# Patient Record
Sex: Male | Born: 1970 | Race: Black or African American | Hispanic: No | Marital: Single | Smoking: Current every day smoker
Health system: Southern US, Community
[De-identification: ages and names within clinical notes are randomized; demographics above are authoritative.]

---

## 2009-09-11 ENCOUNTER — Emergency Department: Payer: Self-pay | Admitting: Internal Medicine

## 2010-08-21 ENCOUNTER — Emergency Department: Payer: Self-pay | Admitting: Emergency Medicine

## 2010-09-16 ENCOUNTER — Emergency Department: Payer: Self-pay | Admitting: Emergency Medicine

## 2014-02-12 ENCOUNTER — Emergency Department: Payer: Self-pay | Admitting: Emergency Medicine

## 2014-06-25 ENCOUNTER — Emergency Department
Admission: EM | Admit: 2014-06-25 | Discharge: 2014-06-25 | Disposition: A | Discharge status: Home / Self Care | Payer: Medicare Other | Attending: Emergency Medicine | Admitting: Emergency Medicine

## 2014-06-25 ENCOUNTER — Encounter: Payer: Self-pay | Admitting: Emergency Medicine

## 2014-06-25 DIAGNOSIS — Z720 Tobacco use: Secondary | ICD-10-CM | POA: Diagnosis not present

## 2014-06-25 DIAGNOSIS — J029 Acute pharyngitis, unspecified: Secondary | ICD-10-CM | POA: Insufficient documentation

## 2014-06-25 MED ORDER — AMOXICILLIN-POT CLAVULANATE 875-125 MG PO TABS
1.0000 | ORAL_TABLET | Freq: Once | ORAL | Status: AC
  Administered 2014-06-25: 1 via ORAL

## 2014-06-25 MED ORDER — LORATADINE-PSEUDOEPHEDRINE ER 5-120 MG PO TB12: 1.0000 | ORAL_TABLET | Freq: Two times a day (BID) | ORAL | Status: AC

## 2014-06-25 MED ORDER — DEXAMETHASONE SODIUM PHOSPHATE 10 MG/ML IJ SOLN
INTRAMUSCULAR | Status: AC
  Administered 2014-06-25: 10 mg via INTRAMUSCULAR
  Filled 2014-06-25: qty 1

## 2014-06-25 MED ORDER — DEXAMETHASONE SODIUM PHOSPHATE 10 MG/ML IJ SOLN
10.0000 mg | Freq: Once | INTRAMUSCULAR | Status: AC
  Administered 2014-06-25: 10 mg via INTRAMUSCULAR

## 2014-06-25 MED ORDER — AMOXICILLIN-POT CLAVULANATE 875-125 MG PO TABS
ORAL_TABLET | ORAL | Status: AC
  Administered 2014-06-25: 1 via ORAL
  Filled 2014-06-25: qty 1

## 2014-06-25 MED ORDER — IBUPROFEN 800 MG PO TABS: 800.0000 mg | ORAL_TABLET | Freq: Three times a day (TID) | ORAL | Status: DC | PRN

## 2014-06-25 MED ORDER — AMOXICILLIN-POT CLAVULANATE 875-125 MG PO TABS: 1.0000 | ORAL_TABLET | Freq: Two times a day (BID) | ORAL | Status: AC

## 2014-06-25 NOTE — ED Notes (Signed)
Pt informed to return if any life threatening symptoms occur.  

## 2014-06-25 NOTE — ED Notes (Signed)
Pt presents to ED with c/o sore throat x 3 days. Pt denies cough. Pt is awake and alert, speaking in complete, coherent sentences without difficulty during triage.

## 2014-06-25 NOTE — ED Provider Notes (Signed)
CSN: [id number]     Arrival date & time 06/25/14  1621 History   First MD Initiated Contact with Patient 06/25/14 1715     Chief Complaint  Patient presents with  . Sore Throat     (Consider location/radiation/quality/duration/timing/severity/associated sxs/prior Treatment) HPI History of present illness on this patient for the last 3 days he's had a worsening sore throat pain with swallowing no difficulty breathing or controlling his secretions subjective fevers taking Tylenol at home and arrived here today for further evaluation treatment rates pain as 8 out of 10 burning pain nothing making it significantly better or worse denies nausea vomiting diarrhea or any other symptoms at this time History reviewed. No pertinent past medical history. History reviewed. No pertinent past surgical history. No family history on file. History  Substance Use Topics  . Smoking status: Current Every Day Smoker  . Smokeless tobacco: Not on file  . Alcohol Use: Yes    Review of Systems  Constitutional: Negative.   HENT: Negative.   Eyes: Negative.   Respiratory: Negative.   Cardiovascular: Negative.   Gastrointestinal: Negative.   Musculoskeletal: Negative.   Skin: Negative.   All other systems reviewed and are negative.     Allergies  Review of patient's allergies indicates no known allergies.  Home Medications   Prior to Admission medications   Medication Sig Start Date End Date Taking? Authorizing Provider  amoxicillin-clavulanate (AUGMENTIN) 875-125 MG per tablet Take 1 tablet by mouth every 12 (twelve) hours. 06/25/14 07/05/14  Ader Fritze William C Zofia Peckinpaugh, PA-C  ibuprofen (ADVIL,MOTRIN) 800 MG tablet Take 1 tablet (800 mg total) by mouth every 8 (eight) hours as needed. 06/25/14   Myla Mauriello William C Dontavius Keim, PA-C  loratadine-pseudoephedrine (CLARITIN-D 12 HOUR) 5-120 MG per tablet Take 1 tablet by mouth 2 (two) times daily. 06/25/14 06/25/15  Eber Ferrufino William C Taleisha Kaczynski, PA-C   BP 127/77 mmHg  Pulse 77   Temp(Src) 98.5 F (36.9 C) (Oral)  Resp 18  Ht 5\' 3"  (1.6 m)  Wt 150 lb (68.04 kg)  BMI 26.58 kg/m2  SpO2 99% Physical Exam Male appearing stated age well-developed well-nourished no acute distress vitals reviewed Head ears eyes nose neck and throat examination of this patient reveals +1 tonsils with exudate bilateral anterior cervical adenopathy postnasal drainage pupils equal round reactive to light and accommodation extraocular motions intact Cardiovascular regular rate and rhythm no murmurs rubs gallops Pulmonary lungs process patient bilaterally Skin supple free rash or disease ED Course  Procedures   MDM  Decision making on this patient worsening sore throat for 3 days +1 tonsils exudate anterior cervical adenopathy treat this patient for presumptive bacterial pharyngitis was given Decadron amoxicillin here is to follow-up 2-3 days if not improving return here for any acute concerns or worsening symptoms Final diagnoses:  Pharyngitis        Dinesh Ulysse Rosalyn GessWilliam C Renee Beale, PA-C 06/25/14 1751  Sharman CheekPhillip Stafford, MD 06/26/14 0009

## 2015-12-31 ENCOUNTER — Encounter: Payer: Self-pay | Admitting: Emergency Medicine

## 2015-12-31 ENCOUNTER — Emergency Department
Admission: EM | Admit: 2015-12-31 | Discharge: 2015-12-31 | Disposition: A | Discharge status: Home / Self Care | Payer: Medicare Other | Attending: Student in an Organized Health Care Education/Training Program | Admitting: Student in an Organized Health Care Education/Training Program

## 2015-12-31 DIAGNOSIS — J029 Acute pharyngitis, unspecified: Secondary | ICD-10-CM | POA: Diagnosis present

## 2015-12-31 DIAGNOSIS — F17200 Nicotine dependence, unspecified, uncomplicated: Secondary | ICD-10-CM | POA: Insufficient documentation

## 2015-12-31 DIAGNOSIS — B2790 Infectious mononucleosis, unspecified without complication: Secondary | ICD-10-CM | POA: Diagnosis not present

## 2015-12-31 DIAGNOSIS — Z791 Long term (current) use of non-steroidal anti-inflammatories (NSAID): Secondary | ICD-10-CM | POA: Diagnosis not present

## 2015-12-31 LAB — MONONUCLEOSIS SCREEN: Mono Screen: POSITIVE — AB

## 2015-12-31 LAB — CBC WITH DIFFERENTIAL/PLATELET
Basophils Absolute: 0.1 K/uL (ref 0–0.1)
Basophils Relative: 1 %
Eosinophils Absolute: 0.3 K/uL (ref 0–0.7)
Eosinophils Relative: 3 %
HCT: 35.8 % — ABNORMAL LOW (ref 40.0–52.0)
Hemoglobin: 12.2 g/dL — ABNORMAL LOW (ref 13.0–18.0)
Lymphocytes Relative: 38 %
Lymphs Abs: 3.7 K/uL — ABNORMAL HIGH (ref 1.0–3.6)
MCH: 28.4 pg (ref 26.0–34.0)
MCHC: 34.2 g/dL (ref 32.0–36.0)
MCV: 83.1 fL (ref 80.0–100.0)
Monocytes Absolute: 1 K/uL (ref 0.2–1.0)
Monocytes Relative: 11 %
Neutro Abs: 4.6 K/uL (ref 1.4–6.5)
Neutrophils Relative %: 47 %
Platelets: 260 K/uL (ref 150–440)
RBC: 4.31 MIL/uL — ABNORMAL LOW (ref 4.40–5.90)
RDW: 14.6 % — ABNORMAL HIGH (ref 11.5–14.5)
WBC: 9.6 K/uL (ref 3.8–10.6)

## 2015-12-31 LAB — POCT RAPID STREP A: Streptococcus, Group A Screen (Direct): NEGATIVE

## 2015-12-31 MED ORDER — PREDNISONE 10 MG PO TABS: 40.0000 mg | ORAL_TABLET | Freq: Every day | ORAL | 0 refills | Status: AC

## 2015-12-31 NOTE — ED Provider Notes (Signed)
[facility] Emergency Department Provider Note  ____________________________________________   First MD Initiated Contact with Patient 12/31/15 2101     (approximate)  I have reviewed the triage vital signs and the nursing notes.   HISTORY  Chief Complaint Sore Throat   HPI Hoyle BarrMichael C Stoney Jr. is a [age] y.o. male presents with a sore throat and right-sided neck swelling for 4 days. Patient states he is having some difficulty swallowing due to pain. Patient denies additional cold symptoms. No fever. Patient states that this has happened once before and was given antibiotics and symptoms resolved. She has not taken anything for symptoms.   History reviewed. No pertinent past medical history.  There are no active problems to display for this patient.   History reviewed. No pertinent surgical history.  Prior to Admission medications   Medication Sig Start Date End Date Taking? Authorizing Provider  ibuprofen (ADVIL,MOTRIN) 800 MG tablet Take 1 tablet (800 mg total) by mouth every 8 (eight) hours as needed. 06/25/14   III William C Ruffian, PA-C  predniSONE (DELTASONE) 10 MG tablet Take 4 tablets (40 mg total) by mouth daily. 12/31/15 01/05/16  Enid DerryAshley Agam Tuohy, PA-C    Allergies Patient has no known allergies.  History reviewed. No pertinent family history.  Social History Social History  Substance Use Topics  . Smoking status: Current Every Day Smoker  . Smokeless tobacco: Never Used  . Alcohol use Yes    Review of Systems Constitutional: No fever/chills. Mild fatigue. Eyes: No visual changes. ENT: Sore throat. Cardiovascular: Denies chest pain. Respiratory: Denies shortness of breath. Gastrointestinal: No abdominal pain.  No nausea, no vomiting.   Genitourinary: Negative for dysuria. Skin: Negative for rash. Neurological: Negative for headaches.  ____________________________________________   PHYSICAL EXAM:  VITAL SIGNS: ED Triage  Vitals  Enc Vitals Group     BP 12/31/15 2043 120/72     Pulse Rate 12/31/15 2043 (!) 56     Resp 12/31/15 2043 18     Temp 12/31/15 2043 98.6 F (37 C)     Temp Source 12/31/15 2043 Oral     SpO2 12/31/15 2043 96 %     Weight 12/31/15 2044 150 lb (68 kg)     Height 12/31/15 2044 5\' 3"  (1.6 m)     Head Circumference --      Peak Flow --      Pain Score 12/31/15 2044 4     Pain Loc --      Pain Edu? --      Excl. in GC? --     Constitutional: Alert and oriented. Well appearing and in no acute distress. Eyes: Conjunctivae are normal.  Head: Atraumatic. Nose: No congestion/rhinnorhea. Mouth/Throat: Mucous membranes are moist.  Oropharynx erythematous. Tonsils non enlarged. Neck: No stridor.  Hematological/Lymphatic/Immunilogical: Cervical lymphadenopathy on right side. No tenderness to palpation Cardiovascular: Normal rate, regular rhythm. Grossly normal heart sounds.  Good peripheral circulation. Respiratory: Normal respiratory effort.  No retractions. Lungs CTAB. Gastrointestinal: Soft and nontender.  Musculoskeletal: No lower extremity tenderness nor edema.  No joint effusions. Neurologic:  Normal speech and language. No gross focal neurologic deficits are appreciated. No gait instability. Skin:  Skin is warm, dry and intact. No rash noted. Psychiatric: Mood and affect are normal. Speech and behavior are normal.  ____________________________________________   LABS (all labs ordered are listed, but only abnormal results are displayed)  Labs Reviewed  CBC WITH DIFFERENTIAL/PLATELET - Abnormal; Notable for the following:  Result Value   RBC 4.31 (*)    Hemoglobin 12.2 (*)    HCT 35.8 (*)    RDW 14.6 (*)    Lymphs Abs 3.7 (*)    All other components within normal limits  MONONUCLEOSIS SCREEN - Abnormal; Notable for the following:    Mono Screen POSITIVE (*)    All other components within normal limits  POCT RAPID STREP A    ____________________________________________  EKG    RADIOLOGY   PROCEDURES  Procedure(s) performed: None  Procedures  Critical Care performed: No  ____________________________________________   INITIAL IMPRESSION / ASSESSMENT AND PLAN / ED COURSE  Pertinent labs & imaging results that were available during my care of the patient were reviewed by me and considered in my medical decision making (see chart for details).  Patient's Mono screen tested positive. Strep was negative. CBC was reassuring. Patient was given a short course of prednisone for swelling. Patient was instructed to follow up with PCP for resolution of lymphadenopathy. Strict return precautions were given.  Clinical Course as of Dec 31 2330  Sat Dec 31, 2015  2303 Mono Screen: Marland Kitchen(!) POSITIVE [AW]    Clinical Course User Index [AW] Enid DerryAshley Jarissa Sheriff, PA-C     ____________________________________________   FINAL CLINICAL IMPRESSION(S) / ED DIAGNOSES  Final diagnoses:  Mononucleosis      NEW MEDICATIONS STARTED DURING THIS VISIT:  New Prescriptions   PREDNISONE (DELTASONE) 10 MG TABLET    Take 4 tablets (40 mg total) by mouth daily.     Note:  This document was prepared using Dragon voice recognition software and may include unintentional dictation errors.    Enid DerryAshley Thania Woodlief, PA-C 12/31/15 2336    Willy EddyPatrick Robinson, MD 01/01/16 [phone]

## 2015-12-31 NOTE — ED Triage Notes (Addendum)
Pt c/o sore throat with swallowing and swollen lymph nodes on the right side of his neck; denies fever; denies earaches; no airway obstruction visualized;

## 2016-07-04 IMAGING — CT CT HEAD WITHOUT CONTRAST
2 series · 14 of 30 positions shown, 16 images · non-contrast
Comparison: None.

CLINICAL DATA: Initial encounter for fall from standing position
last evening while drinking. Hit head on concrete. Abrasion to
forehead.

EXAM:
CT HEAD WITHOUT CONTRAST
TECHNIQUE: Contiguous axial images were obtained from the base of the skull
through the vertex without intravenous contrast.

[Series 2: head wo · axial · 0.39mm/px · z∈[+353,+452]mm · 6 of 32 slices shown, 8 images]
[im 5/32  brain]
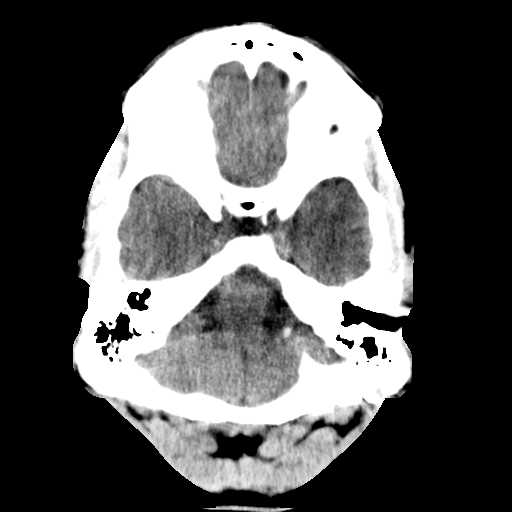
[im 5/32  bone]
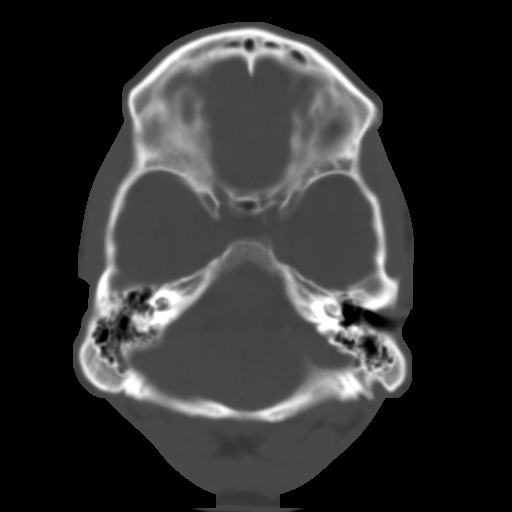
[im 9/32  brain]
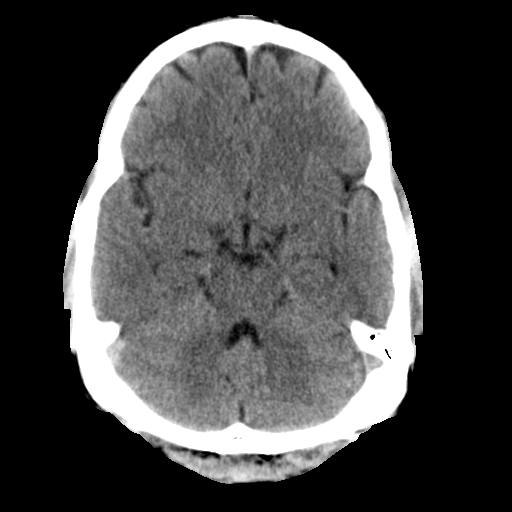
[im 14/32  brain]
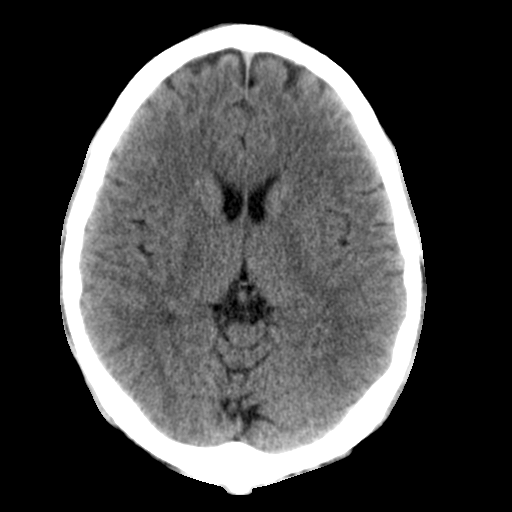
[im 18/32  brain]
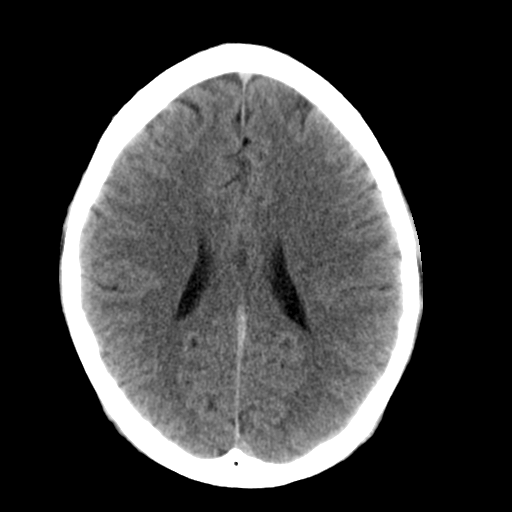
[im 23/32  brain]
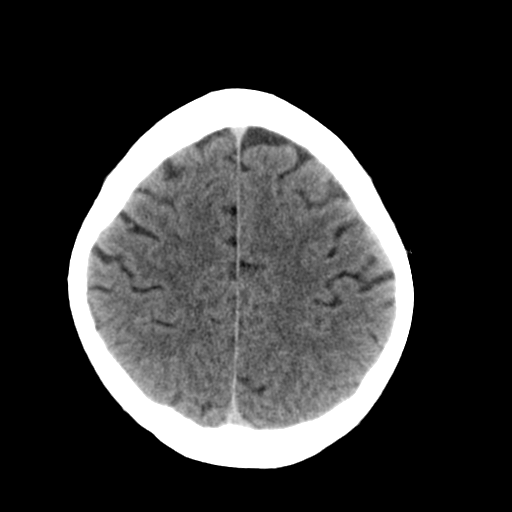
[im 23/32  bone]
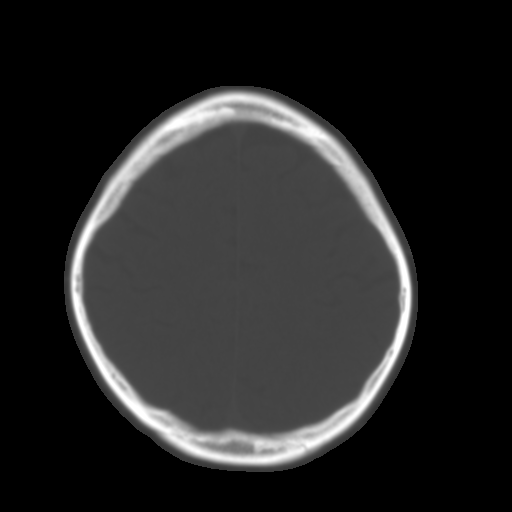
[im 27/32  brain]
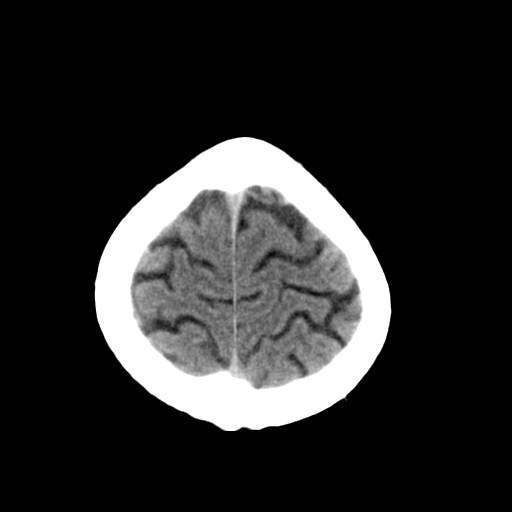

[Series 3: head bone · axial · 0.39mm/px · z∈[+347,+461]mm · 8 of 96 slices shown]
[im 10/96  bone]
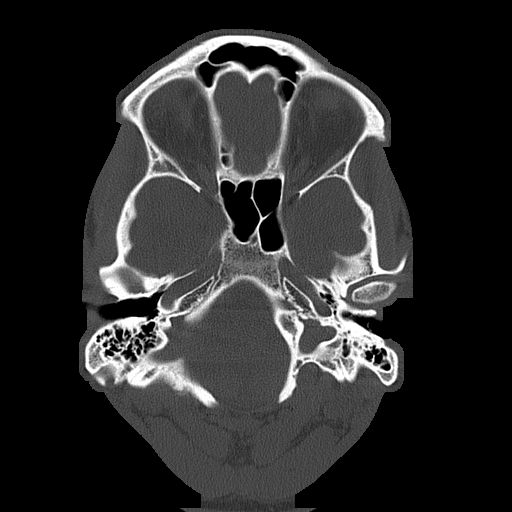
[im 19/96  bone]
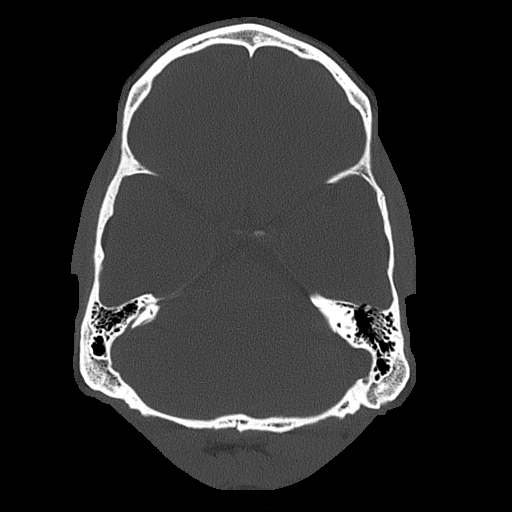
[im 32/96  bone]
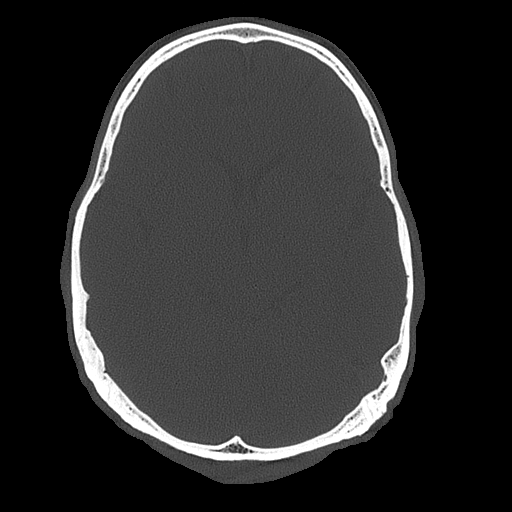
[im 41/96  bone]
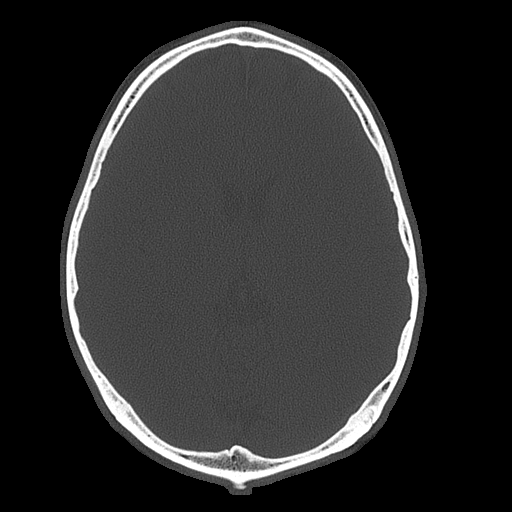
[im 55/96  bone]
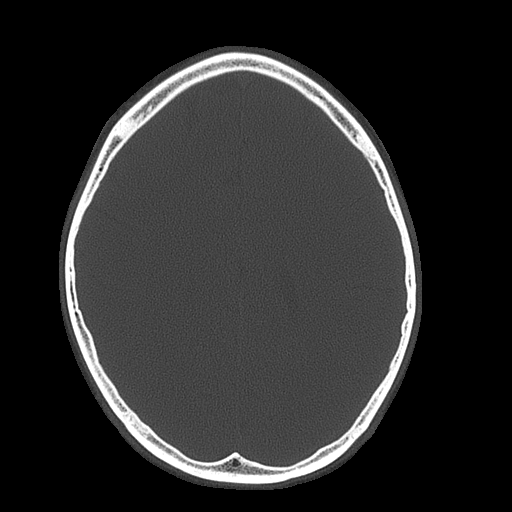
[im 64/96  bone]
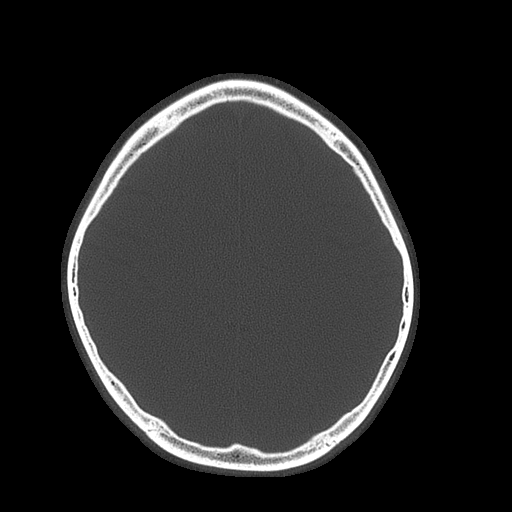
[im 77/96  bone]
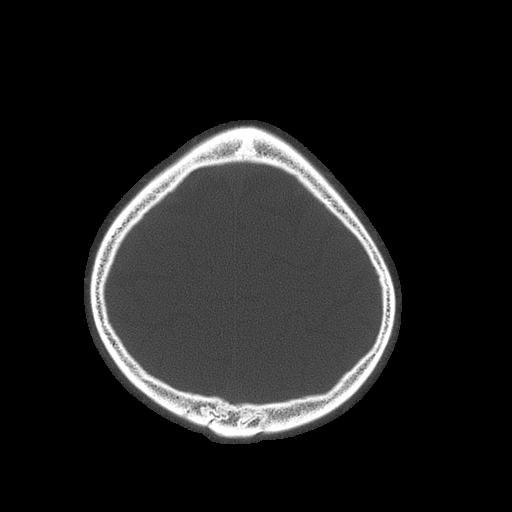
[im 86/96  bone]
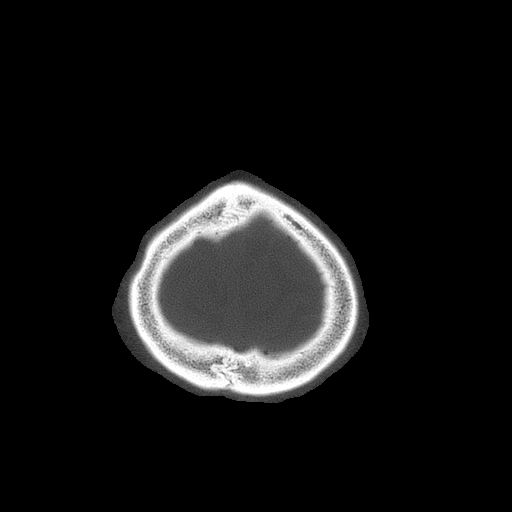

[14 of 30 positions shown; findings below may reference images not displayed]

FINDINGS: There is no evidence for acute hemorrhage, hydrocephalus, mass
lesion, or abnormal extra-axial fluid collection. No definite CT
evidence for acute infarction. The visualized paranasal sinuses and
mastoid air cells are clear. Frontal scalp swelling is evident
without underlying skull fracture.
IMPRESSION: Normal CT exam of the brain.

## 2017-06-29 ENCOUNTER — Encounter: Payer: Self-pay | Admitting: *Deleted

## 2017-06-29 ENCOUNTER — Other Ambulatory Visit: Payer: Self-pay

## 2017-06-29 ENCOUNTER — Emergency Department
Admission: EM | Admit: 2017-06-29 | Discharge: 2017-06-29 | Disposition: A | Discharge status: Home / Self Care | Payer: Medicare Other | Attending: Emergency Medicine | Admitting: Emergency Medicine

## 2017-06-29 DIAGNOSIS — R591 Generalized enlarged lymph nodes: Secondary | ICD-10-CM | POA: Diagnosis not present

## 2017-06-29 DIAGNOSIS — Z5321 Procedure and treatment not carried out due to patient leaving prior to being seen by health care provider: Secondary | ICD-10-CM | POA: Insufficient documentation

## 2017-06-29 NOTE — ED Notes (Signed)
No answer when called for room 

## 2017-06-29 NOTE — ED Notes (Signed)
Called for room, no answer

## 2017-06-29 NOTE — ED Notes (Signed)
Called for room  No answer  

## 2017-06-29 NOTE — ED Triage Notes (Signed)
Pt reports his right side lymph nodes are swollen for several days. Pt denies fevers, sore throat or other symptoms. Says he just feels sensitive on the right side.

## 2017-07-07 ENCOUNTER — Encounter: Payer: Self-pay | Admitting: Emergency Medicine

## 2017-07-07 ENCOUNTER — Emergency Department
Admission: EM | Admit: 2017-07-07 | Discharge: 2017-07-07 | Disposition: A | Discharge status: Home / Self Care | Payer: Medicare Other | Attending: Emergency Medicine | Admitting: Emergency Medicine

## 2017-07-07 ENCOUNTER — Other Ambulatory Visit: Payer: Self-pay

## 2017-07-07 DIAGNOSIS — F17200 Nicotine dependence, unspecified, uncomplicated: Secondary | ICD-10-CM | POA: Insufficient documentation

## 2017-07-07 DIAGNOSIS — R51 Headache: Secondary | ICD-10-CM | POA: Insufficient documentation

## 2017-07-07 DIAGNOSIS — R519 Headache, unspecified: Secondary | ICD-10-CM

## 2017-07-07 MED ORDER — AMOXICILLIN 500 MG PO CAPS: 500.0000 mg | ORAL_CAPSULE | Freq: Three times a day (TID) | ORAL | 0 refills | Status: DC

## 2017-07-07 MED ORDER — MAGIC MOUTHWASH W/LIDOCAINE: 5.0000 mL | Freq: Three times a day (TID) | ORAL | 0 refills | Status: DC

## 2017-07-07 MED ORDER — PREDNISONE 10 MG PO TABS: ORAL_TABLET | ORAL | 0 refills | Status: DC

## 2017-07-07 NOTE — Discharge Instructions (Addendum)
OPTIONS FOR DENTAL FOLLOW UP CARE ° °[place] [facility] and [facility] - Local Safety Net Dental Clinics °http://www.ncdhhs.gov/dph/oralhealth/services/safetynetclinics.htm °  °[facility] (336-562-3123) ° °[facility] [place] ([phone]) ° °[facility] ([phone] ext [phone]) ° °[facility] ([phone]) ° °[facility] ([phone]) °This clinic caters to the indigent population and is on a lottery system. °Location: °[facility], [address], [place] °Clinic Hours: °Wednesdays from 6pm - 9pm, patients seen by a lottery system. °For dates, call or go to www.med.[facility].edu/[facility]/patients/Dental-[facility] °Services: °Cleanings, fillings and simple extractions. °Payment Options: °DENTAL WORK IS FREE OF CHARGE. Bring proof of income or support. °Best way to get seen: °Arrive at 5:15 pm - this is a lottery, NOT first come/first serve, so arriving earlier will not increase your chances of being seen. °  °  °[facility] °[phone] °Select option 1 for emergencies °  °Location: °[facility], [address], [place] °Clinic Hours: °No walk-ins accepted - call the day before to schedule an appointment. °Check in times are 9:30 am and 1:30 pm. °Services: °Simple extractions, temporary fillings, pulpectomy/pulp debridement, uncomplicated abscess drainage. °Payment Options: °PAYMENT IS DUE AT THE TIME OF SERVICE.  Fee is usually $100-200, additional surgical procedures (e.g. abscess drainage) may be extra. °Cash, checks, Visa/MasterCard accepted.  Can file Medicaid if patient is covered for dental - patient should call case worker to check. °No discount for [facility] patients. °Best way to get seen: °MUST call the day before and get onto the schedule. Can usually be seen the next 1-2 days. No walk-ins accepted. °  °  °[facility] °[phone] °   °Location: °[facility], [address], [place] °Clinic Hours: °M, W, Th, F 8am or 1:30pm, Tues 9a or 1:30 - first come/first served. °Services: °Simple extractions, temporary fillings, uncomplicated abscess drainage.  You do not need to be an [place] resident. °Payment Options: °PAYMENT IS DUE AT THE TIME OF SERVICE. °Dental insurance, otherwise sliding scale - bring proof of income or support. °Depending on income and treatment needed, cost is usually $50-200. °Best way to get seen: °Arrive early as it is first come/first served. °  °  °[facility] °[phone] °  °Location: °[address] °Clinic Hours: °Mon-Thu 8a-5p °Services: °Most basic dental services including extractions and fillings. °Payment Options: °PAYMENT IS DUE AT THE TIME OF SERVICE. °Sliding scale, up to 50% off - bring proof if income or support. °Medicaid with dental option accepted. °Best way to get seen: °Call to schedule an appointment, can usually be seen within 2 weeks OR they will try to see walk-ins - show up at 8a or 2p (you may have to wait). °  °  °[facility] °919-245-2435 °[place] RESIDENTS ONLY °  °Location: °[facility], [address], [place], [place] [address] °Clinic Hours: By appointment only. °Monday - Thursday 8am-5pm, Friday 8am-12pm °Services: Cleanings, fillings, extractions. °Payment Options: °PAYMENT IS DUE AT THE TIME OF SERVICE. °Cash, Visa or MasterCard. Sliding scale - $30 minimum per service. °Best way to get seen: °Come in to office, complete packet and make an appointment - need proof of income °or support monies for each household member and proof of [place] residence. °Usually takes about a month to get in. °  °  °[facility] °[phone] °  °Location: °[address]   [place] °Clinic Hours: Walk-in Urgent Care Dental Services are offered Monday-Friday  mornings only. °The numbers of emergencies accepted daily is limited to the number of °providers available. °Maximum 15 - Mondays, Wednesdays & Thursdays °Maximum 10 - Tuesdays & Fridays °Services: °You do not need to be a [place] resident to be seen for a dental emergency. °Emergencies are defined as pain, swelling, abnormal bleeding, or dental trauma. Walkins will receive x-rays if needed. °NOTE: Dental cleaning is not an emergency. °Payment Options: °PAYMENT IS DUE AT THE TIME OF SERVICE. °Minimum co-pay is $40.00 for uninsured patients. °Minimum co-pay is $3.00 for Medicaid with dental coverage. °Dental Insurance is accepted and must be presented at time of visit. °Medicare does not cover dental. °Forms of payment: Cash, credit card, checks. °Best way to get seen: °If not previously registered with the clinic, walk-in dental registration begins at 7:15 am and is on a first come/first serve basis. °If previously registered with the clinic, call to make an appointment. °  °  °[facility] °[phone] °[place] RESIDENTS ONLY °  °Location: °[address], [place], [place] °Clinic Hours: °Mon-Thu 10a-2p °Services: Extractions only! °Payment Options: °FREE (donations accepted) - bring proof of income or support °Best way to get seen: °Call and schedule an appointment OR come at 8am on the 1st Monday of every month (except for holidays) when it is first come/first served. °  °  °[facility] °[phone] °  °Location: °[address], [place] °Clinic Hours: °Friday mornings °Services, Payment Options, Best way to get seen: °Call for info °

## 2017-07-07 NOTE — ED Triage Notes (Signed)
C/O swollen gland to right neck x 1 week.  Also c/o right ear pain.  Now throat starting to hurt.    AAOx3.  Skin warm and dry. NAD

## 2017-07-07 NOTE — ED Provider Notes (Signed)
[facility] Emergency Department Provider Note  ____________________________________________  Time seen: Approximately 10:32 AM  I have reviewed the triage vital signs and the nursing notes.   HISTORY  Chief Complaint Lymphadenopathy    HPI Michael Fenn. is a 47 y.o. male that presents emergency department for evaluation of right cheek pain for 1 week.  He states that pain and swelling comes and goes. Patient states that now his ear, throat, jaw are starting to hurt.  He denies any dental problems but has not seen a dentist in many years.  No tick bites.  No recent travel. No headache, recent illness. No headache, fever, chills, weight loss, cough, shortness of breath, nausea, vomiting.  History reviewed. No pertinent past medical history.  There are no active problems to display for this patient.   History reviewed. No pertinent surgical history.  Prior to Admission medications   Medication Sig Start Date End Date Taking? Authorizing Provider  amoxicillin (AMOXIL) 500 MG capsule Take 1 capsule (500 mg total) by mouth 3 (three) times daily. 07/07/17   Enid Derry, PA-C  ibuprofen (ADVIL,MOTRIN) 800 MG tablet Take 1 tablet (800 mg total) by mouth every 8 (eight) hours as needed. 06/25/14   Ruffian, III Kristine Garbe, PA-C  magic mouthwash w/lidocaine SOLN Take 5 mLs by mouth 3 (three) times daily. 07/07/17   Enid Derry, PA-C  predniSONE (DELTASONE) 10 MG tablet Take 6 tablets on day 1, take 5 tablets on day 2, take 4 tablets on day 3, take 3 tablets on day 4, take 2 tablets on day 5, take 1 tablet on day 6 07/07/17   Enid Derry, PA-C    Allergies Patient has no known allergies.  No family history on file.  Social History Social History   Tobacco Use  . Smoking status: Current Every Day Smoker  . Smokeless tobacco: Never Used  Substance Use Topics  . Alcohol use: Yes  . Drug use: No     Review of Systems  Constitutional: No  fever/chills Eyes: No visual changes. No discharge. ENT: Negative for congestion and rhinorrhea. Cardiovascular: No chest pain. Respiratory: Negative for cough. No SOB. Gastrointestinal: No abdominal pain.  No nausea, no vomiting.  Musculoskeletal: Negative for musculoskeletal pain. Skin: Negative for rash, abrasions, lacerations, ecchymosis. Neurological: Negative for headaches.   ____________________________________________   PHYSICAL EXAM:  VITAL SIGNS: ED Triage Vitals  Enc Vitals Group     BP 07/07/17 0920 (!) 144/77     Pulse Rate 07/07/17 0920 (!) 56     Resp 07/07/17 0920 16     Temp 07/07/17 0920 98.2 F (36.8 C)     Temp Source 07/07/17 0920 Oral     SpO2 07/07/17 0920 100 %     Weight 07/07/17 0920 145 lb (65.8 kg)     Height 07/07/17 0920 5\' 3"  (1.6 m)     Head Circumference --      Peak Flow --      Pain Score 07/07/17 0919 10     Pain Loc --      Pain Edu? --      Excl. in GC? --      Constitutional: Alert and oriented. Well appearing and in no acute distress. Eyes: Conjunctivae are normal. PERRL. EOMI. No discharge. Head: Atraumatic. ENT: No frontal and maxillary sinus tenderness.      Ears: Tympanic membranes pearly gray with good landmarks. No discharge.      Nose: Mild congestion/rhinnorhea.  Mouth/Throat: Mucous membranes are moist. Oropharynx non-erythematous. Tonsils not enlarged. No exudates. Uvula midline. Pinpoint spot of tenderness to palpation over patient posterior cheek. No palpable mass or area of swelling or fluctuance. No TMJ pain.  Neck: No stridor.   Hematological/Lymphatic/Immunilogical: No appreciable cervical lymphadenopathy. Cardiovascular: Normal rate, regular rhythm.  Good peripheral circulation. Respiratory: Normal respiratory effort without tachypnea or retractions. Lungs CTAB. Good air entry to the bases with no decreased or absent breath sounds. Gastrointestinal: Bowel sounds 4 quadrants. Soft and nontender to palpation.  No guarding or rigidity. No palpable masses. No distention. Musculoskeletal: Full range of motion to all extremities. No gross deformities appreciated. Neurologic:  Normal speech and language. No gross focal neurologic deficits are appreciated.  Skin:  Skin is warm, dry and intact. No rash noted. Psychiatric: Mood and affect are normal. Speech and behavior are normal. Patient exhibits appropriate insight and judgement.   ____________________________________________   LABS (all labs ordered are listed, but only abnormal results are displayed)  Labs Reviewed - No data to display ____________________________________________  EKG   ____________________________________________  RADIOLOGY  No results found.  ____________________________________________    PROCEDURES  Procedure(s) performed:    Procedures    Medications - No data to display   ____________________________________________   INITIAL IMPRESSION / ASSESSMENT AND PLAN / ED COURSE  Pertinent labs & imaging results that were available during my care of the patient were reviewed by me and considered in my medical decision making (see chart for details).  Review of the [place] CSRS was performed in accordance of the [facility] prior to dispensing any controlled drugs.     Patient presented to the emergency department for evaluation of right cheek pain for 1 week. Vital signs and exam are reassuring. No palpable mass or abscess. No tenderness to palpation over TMJ or jaw. Patient will be covered for a dental infection. Patient should alternate tylenol and ibuprofen for fever. Patient feels comfortable going home. Patient will be discharged home with prescriptions for amoxicillin, prednisone, magic mouthwash. Patient is to follow up with dentist and patient is agreeable to call dentist this week. Patient is given ED precautions to return to the ED for any worsening or new  symptoms.     ____________________________________________  FINAL CLINICAL IMPRESSION(S) / ED DIAGNOSES  Final diagnoses:  Facial pain      NEW MEDICATIONS STARTED DURING THIS VISIT:  ED Discharge Orders        Ordered    amoxicillin (AMOXIL) 500 MG capsule  3 times daily     07/07/17 1111    predniSONE (DELTASONE) 10 MG tablet     07/07/17 1111    magic mouthwash w/lidocaine SOLN  3 times daily     07/07/17 1111          This chart was dictated using voice recognition software/Dragon. Despite best efforts to proofread, errors can occur which can change the meaning. Any change was purely unintentional.    Enid DerryWagner, Herold Salguero, PA-C 07/07/17 1745    Phineas Page, [place], MD 07/08/17 [phone]

## 2017-07-18 ENCOUNTER — Other Ambulatory Visit: Payer: Self-pay

## 2017-07-18 ENCOUNTER — Emergency Department
Admission: EM | Admit: 2017-07-18 | Discharge: 2017-07-18 | Disposition: A | Discharge status: Home / Self Care | Payer: Medicare Other | Attending: Emergency Medicine | Admitting: Emergency Medicine

## 2017-07-18 DIAGNOSIS — F17210 Nicotine dependence, cigarettes, uncomplicated: Secondary | ICD-10-CM | POA: Diagnosis not present

## 2017-07-18 DIAGNOSIS — K047 Periapical abscess without sinus: Secondary | ICD-10-CM | POA: Insufficient documentation

## 2017-07-18 DIAGNOSIS — R51 Headache: Secondary | ICD-10-CM | POA: Diagnosis present

## 2017-07-18 MED ORDER — CLINDAMYCIN PHOSPHATE 600 MG/4ML IJ SOLN
600.0000 mg | Freq: Once | INTRAMUSCULAR | Status: AC
  Administered 2017-07-18: 600 mg via INTRAMUSCULAR
  Filled 2017-07-18 (×2): qty 4

## 2017-07-18 MED ORDER — ACETAMINOPHEN-CODEINE #3 300-30 MG PO TABS: 1.0000 | ORAL_TABLET | Freq: Four times a day (QID) | ORAL | 0 refills | Status: DC | PRN

## 2017-07-18 MED ORDER — CLINDAMYCIN PHOSPHATE 600 MG/4ML IJ SOLN
600.0000 mg | Freq: Once | INTRAMUSCULAR | Status: DC
  Filled 2017-07-18: qty 4

## 2017-07-18 NOTE — Discharge Instructions (Signed)
Continue taking amoxicillin that you have left.  You were given a injection of clindamycin while in the emergency department.  Continue with ibuprofen for inflammation.  Tylenol 3 has codeine in it and should be taken every 6 hours if needed for moderate pain.  Do not take this medication and drive or operate machinery as it could increase your risk for injury.  Call 1 of the dental clinics listed on your discharge papers.  OPTIONS FOR DENTAL FOLLOW UP CARE  [place] Department of Health and Human Services - Local Safety Net Dental Clinics [email or web address]://www.ncdhhs.gov/dph/oralhealth/services/safetynetclinics.htm   [facility] [phone])  [facility] [phone])  [facility] [phone])  [facility] [phone]  [facility] [phone]) This clinic caters to the indigent population and is on a lottery system. Location: [employer], [address], [address], [place] [facility] Hours: Wednesdays from 6pm - 9pm, patients seen by a lottery system. For dates, call or go to [email or web address] Services: Cleanings, fillings and simple extractions. Payment Options: DENTAL WORK IS FREE OF CHARGE. Bring proof of income or support. Best way to get seen: Arrive at 5:15 pm - this is a lottery, NOT first come/first serve, so arriving earlier will not increase your chances of being seen.     [facility] [phone] Select option 1 for emergencies   Location: [facility], [address], [address], [place] [facility] Hours: No walk-ins accepted - call the day before to schedule an appointment. Check in times are 9:30 am and 1:30 pm. Services: Simple extractions, temporary fillings, pulpectomy/pulp debridement, uncomplicated abscess drainage. Payment Options: PAYMENT IS DUE AT THE TIME OF SERVICE.  Fee is usually $100-200, additional  surgical procedures (e.g. abscess drainage) may be extra. Cash, checks, Visa/MasterCard accepted.  Can file Medicaid if patient is covered for dental - patient should call case worker to check. No discount for [facility] patients. Best way to get seen: MUST call the day before and get onto the schedule. Can usually be seen the next 1-2 days. No walk-ins accepted.     [facility] [phone]   Location: [facility], [address], [place] [facility] Hours: M, W, Th, F 8am or 1:30pm, Tues 9a or 1:30 - first come/first served. Services: Simple extractions, temporary fillings, uncomplicated abscess drainage.  You do not need to be an [place] resident. Payment Options: PAYMENT IS DUE AT THE TIME OF SERVICE. Dental insurance, otherwise sliding scale - bring proof of income or support. Depending on income and treatment needed, cost is usually $50-200. Best way to get seen: Arrive early as it is first come/first served.     [facility] [phone]   Location: [address] Clinic Hours: Mon-Thu 8a-5p Services: Most basic dental services including extractions and fillings. Payment Options: PAYMENT IS DUE AT THE TIME OF SERVICE. Sliding scale, up to 50% off - bring proof if income or support. Medicaid with dental option accepted. Best way to get seen: Call to schedule an appointment, can usually be seen within 2 weeks OR they will try to see walk-ins - show up at 8a or 2p (you may have to wait).     [facility] [phone] [place] RESIDENTS ONLY   Location: [facility], [address], [place], [place] [address] Clinic Hours: By appointment only. Monday - Thursday 8am-5pm, Friday 8am-12pm Services: Cleanings, fillings, extractions. Payment  Options: PAYMENT IS DUE AT THE TIME OF SERVICE. Cash, Visa or MasterCard. Sliding scale - $30 minimum per  service. Best way to get seen: Come in to office, complete packet and make an appointment - need proof of income or support monies for each household member and proof of [place] residence. Usually takes about a month to get in.     [facility] [phone]   Location: [address] [facility] Hours: Walk-in Urgent Care Dental Services are offered Monday-Friday mornings only. The numbers of emergencies accepted daily is limited to the number of providers available. Maximum 15 - Mondays, Wednesdays & Thursdays Maximum 10 - Tuesdays & Fridays Services: You do not need to be a [place] resident to be seen for a dental emergency. Emergencies are defined as pain, swelling, abnormal bleeding, or dental trauma. Walkins will receive x-rays if needed. NOTE: Dental cleaning is not an emergency. Payment Options: PAYMENT IS DUE AT THE TIME OF SERVICE. Minimum co-pay is $40.00 for uninsured patients. Minimum co-pay is $3.00 for Medicaid with dental coverage. Dental Insurance is accepted and must be presented at time of visit. Medicare does not cover dental. Forms of payment: Cash, credit card, checks. Best way to get seen: If not previously registered with the clinic, walk-in dental registration begins at 7:15 am and is on a first come/first serve basis. If previously registered with the clinic, call to make an appointment.     [facility] [phone] [place] RESIDENTS ONLY   Location: [address], [place], [place] Clinic Hours: Mon-Thu 10a-2p Services: Extractions only! Payment Options: FREE (donations accepted) - bring proof of income or support Best way to get seen: Call and schedule an appointment OR come at 8am on the 1st Monday of every month (except for holidays) when it is first come/first served.     [facility] [phone]   Location: [address], [place] [facility] Hours: Friday  mornings Services, Payment Options, Best way to get seen: Call for info

## 2017-07-18 NOTE — ED Provider Notes (Signed)
[facility] Emergency Department Provider Note  ____________________________________________   First MD Initiated Contact with Patient 07/18/17 1137     (approximate)  I have reviewed the triage vital signs and the nursing notes.   HISTORY  Chief Complaint Otalgia  HPI Michael Page. is a 47 y.o. male is here with complaint of right sided facial pain.  Patient states he was seen last week and treated for an ear infection.  He states initially when he began taking the amoxicillin he had some improvement but now feels that the pain has localized right facial area.  He continues to take the amoxicillin without any improvement of his symptoms.  He is not aware of any fever or chills.  He is unaware of any dental caries but has not seen dentist in a long time.  He states the pain increases when he touches his jaw.  Currently he rates his pain as 10/10.  He has been taking ibuprofen without any relief.   History reviewed. No pertinent past medical history.  There are no active problems to display for this patient.   History reviewed. No pertinent surgical history.  Prior to Admission medications   Medication Sig Start Date End Date Taking? Authorizing Provider  amoxicillin (AMOXIL) 500 MG capsule Take 1 capsule (500 mg total) by mouth 3 (three) times daily. 07/07/17  Yes Enid Derry, PA-C  ibuprofen (ADVIL,MOTRIN) 800 MG tablet Take 1 tablet (800 mg total) by mouth every 8 (eight) hours as needed. 06/25/14  Yes Ruffian, III Kristine Garbe, PA-C  predniSONE (DELTASONE) 10 MG tablet Take 6 tablets on day 1, take 5 tablets on day 2, take 4 tablets on day 3, take 3 tablets on day 4, take 2 tablets on day 5, take 1 tablet on day 6 07/07/17  Yes Enid Derry, PA-C  acetaminophen-codeine (TYLENOL #3) 300-30 MG tablet Take 1 tablet by mouth every 6 (six) hours as needed for moderate pain. 07/18/17   Tommi Rumps, PA-C  magic mouthwash w/lidocaine SOLN Take 5 mLs by  mouth 3 (three) times daily. 07/07/17   Enid Derry, PA-C    Allergies Patient has no known allergies.  No family history on file.  Social History Social History   Tobacco Use  . Smoking status: Current Every Day Smoker  . Smokeless tobacco: Never Used  Substance Use Topics  . Alcohol use: Yes  . Drug use: No    Review of Systems Constitutional: No fever/chills Cardiovascular: Denies chest pain. Respiratory: Denies shortness of breath. Genitourinary: Negative for dysuria. Skin: Negative for rash. Neurological: Negative for headaches, focal weakness or numbness. ____________________________________________   PHYSICAL EXAM:  VITAL SIGNS: ED Triage Vitals  Enc Vitals Group     BP 07/18/17 1117 (!) 167/87     Pulse Rate 07/18/17 1117 (!) 54     Resp 07/18/17 1117 20     Temp 07/18/17 1117 97.6 F (36.4 C)     Temp Source 07/18/17 1117 Oral     SpO2 07/18/17 1117 100 %     Weight 07/18/17 1117 150 lb (68 kg)     Height 07/18/17 1117 5\' 3"  (1.6 m)     Head Circumference --      Peak Flow --      Pain Score 07/18/17 1123 10     Pain Loc --      Pain Edu? --      Excl. in [place]? --    Constitutional: Alert and oriented.  Well appearing and in no acute distress. Eyes: Conjunctivae are normal.  Head: Atraumatic. Nose: No congestion/rhinnorhea.  TMs are clear bilaterally.  EACs are without exudate. Mouth/Throat: Mucous membranes are moist.  Oropharynx non-erythematous.  There is no obvious dental abscess and no caries noted.  There is moderate tenderness on palpation of the right mandible just over the premolars.  There is some soft tissue swelling in this area. Neck: No stridor.   Hematological/Lymphatic/Immunilogical: Positive bilateral cervical lymphadenopathy. Cardiovascular: Normal rate, regular rhythm. Grossly normal heart sounds.  Good peripheral circulation. Respiratory: Normal respiratory effort.  No retractions. Lungs CTAB. Musculoskeletal: Moves upper and lower  extremities without any difficulty.  Normal gait was noted. Neurologic:  Normal speech and language. No gross focal neurologic deficits are appreciated.  Skin:  Skin is warm, dry and intact. No rash noted. Psychiatric: Mood and affect are normal. Speech and behavior are normal.  ____________________________________________   LABS (all labs ordered are listed, but only abnormal results are displayed)  Labs Reviewed - No data to display  PROCEDURES  Procedure(s) performed: None  Procedures  Critical Care performed: No  ____________________________________________   INITIAL IMPRESSION / ASSESSMENT AND PLAN / ED COURSE  As part of my medical decision making, I reviewed the following data within the electronic MEDICAL RECORD NUMBER Notes from prior ED visits and [place] Controlled Substance Database  Patient is continued taking the amoxicillin.  He was given clindamycin IM while in the department.  He is also to continue taking ibuprofen but a prescription for Tylenol 3 was given to be taken every 6 hours as needed for moderate pain.  He is encouraged to call 1 of the dental clinics listed on his discharge papers for further evaluation.  ____________________________________________   FINAL CLINICAL IMPRESSION(S) / ED DIAGNOSES  Final diagnoses:  Dental abscess     ED Discharge Orders        Ordered    acetaminophen-codeine (TYLENOL #3) 300-30 MG tablet  Every 6 hours PRN     07/18/17 1234       Note:  This document was prepared using Dragon voice recognition software and may include unintentional dictation errors.    Tommi RumpsSummers, Estefani Bateson L, PA-C 07/18/17 1301    Emily FilbertWilliams, Jonathan E, MD 07/18/17 [phone]

## 2017-07-18 NOTE — ED Notes (Addendum)
Pain right ear canal and down to jaw.  Is on antibiotics for swollen glands from previous visit.  No exudate. Patient has some visible swelling on right side of jaw.  He denies having any problems with any teeth.

## 2017-07-18 NOTE — ED Triage Notes (Signed)
Pt states he was seen here last week with an ear infection and treated, states it got better but never went away and last night the pain worsened.,

## 2018-10-07 ENCOUNTER — Other Ambulatory Visit (INDEPENDENT_AMBULATORY_CARE_PROVIDER_SITE_OTHER): Payer: Self-pay | Admitting: Internal Medicine

## 2020-04-26 LAB — COLOGUARD: COLOGUARD: NEGATIVE

## 2022-02-16 ENCOUNTER — Emergency Department: Payer: 59

## 2022-02-16 ENCOUNTER — Emergency Department
Admission: EM | Admit: 2022-02-16 | Discharge: 2022-02-16 | Disposition: A | Discharge status: Home / Self Care | Payer: 59 | Attending: Emergency Medicine | Admitting: Emergency Medicine

## 2022-02-16 DIAGNOSIS — S199XXA Unspecified injury of neck, initial encounter: Secondary | ICD-10-CM | POA: Diagnosis present

## 2022-02-16 DIAGNOSIS — R930 Abnormal findings on diagnostic imaging of skull and head, not elsewhere classified: Secondary | ICD-10-CM | POA: Insufficient documentation

## 2022-02-16 DIAGNOSIS — Y92410 Unspecified street and highway as the place of occurrence of the external cause: Secondary | ICD-10-CM | POA: Diagnosis not present

## 2022-02-16 DIAGNOSIS — S161XXA Strain of muscle, fascia and tendon at neck level, initial encounter: Secondary | ICD-10-CM | POA: Diagnosis not present

## 2022-02-16 DIAGNOSIS — M545 Low back pain, unspecified: Secondary | ICD-10-CM | POA: Insufficient documentation

## 2022-02-16 MED ORDER — NAPROXEN 500 MG PO TABS: 500.0000 mg | ORAL_TABLET | Freq: Two times a day (BID) | ORAL | 0 refills | Status: DC

## 2022-02-16 MED ORDER — CYCLOBENZAPRINE HCL 10 MG PO TABS: 10.0000 mg | ORAL_TABLET | Freq: Three times a day (TID) | ORAL | 0 refills | Status: DC | PRN
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
## 2022-07-27 ENCOUNTER — Telehealth: Payer: Self-pay

## 2022-07-27 NOTE — Telephone Encounter (Signed)
Left message for patient to call with cardiac history.

## 2022-08-01 ENCOUNTER — Ambulatory Visit: Payer: 59 | Attending: Cardiology | Admitting: Cardiology

## 2022-08-01 ENCOUNTER — Encounter: Payer: Self-pay | Admitting: Cardiology

## 2022-08-01 VITALS — BP 140/84 | HR 69 | Ht 63.0 in | Wt 132.5 lb

## 2022-08-01 DIAGNOSIS — F17290 Nicotine dependence, other tobacco product, uncomplicated: Secondary | ICD-10-CM

## 2022-08-01 DIAGNOSIS — R072 Precordial pain: Secondary | ICD-10-CM | POA: Diagnosis not present

## 2022-08-01 DIAGNOSIS — F17200 Nicotine dependence, unspecified, uncomplicated: Secondary | ICD-10-CM | POA: Diagnosis not present

## 2022-08-01 NOTE — Patient Instructions (Signed)
Medication Instructions:  Your physician recommends that you continue on your current medications as directed. Please refer to the Current Medication list given to you today.  *If you need a refill on your cardiac medications before your next appointment, please call your pharmacy*  Lab Work: -None ordered  Testing/Procedures: Your physician has requested that you have an echocardiogram. Echocardiography is a painless test that uses sound waves to create images of your heart. It provides your doctor with information about the size and shape of your heart and how well your heart's chambers and valves are working. This procedure takes approximately one hour. There are no restrictions for this procedure. Please do NOT wear cologne, perfume, aftershave, or lotions (deodorant is allowed). Please arrive 15 minutes prior to your appointment time.    Follow-Up: At [facility], you and your health needs are our priority.  As part of our continuing mission to provide you with exceptional heart care, we have created designated Provider Care Teams.  These Care Teams include your primary Cardiologist (physician) and Advanced Practice Providers (APPs -  Physician Assistants and Nurse Practitioners) who all work together to provide you with the care you need, when you need it.  We recommend signing up for the patient portal called "MyChart".  Sign up information is provided on this After Visit Summary.  MyChart is used to connect with patients for Virtual Visits (Telemedicine).  Patients are able to view lab/test results, encounter notes, upcoming appointments, etc.  Non-urgent messages can be sent to your provider as well.   To learn more about what you can do with MyChart, go to [email or web address].    Your next appointment:   2 - 3 month(s)  Provider:   You may see Debbe Odea, MD or one of the following Advanced Practice Providers on your designated Care Team:   Nicolasa Ducking,  NP Eula Listen, PA-C Cadence Fransico Andron, PA-C Charlsie Quest, NP    Other Instructions -None

## 2022-08-01 NOTE — Progress Notes (Signed)
Cardiology Office Note:    Date:  08/01/2022   ID:  Michael Barr., DOB 05-27-1970, MRN [id number]  PCP:  Aviva Kluver   [facility] Providers Cardiologist:  Debbe Odea, MD     Referring MD: Alease Medina, MD   Chief Complaint  Patient presents with   New Patient (Initial Visit)    Referred for cardiac evaluation of intermittent chest pains with no cardiac history.      History of Present Illness:    Michael Carion. is a 52 y.o. male with a hx of GERD, currently smokes cigars who presents with chest pain.  States having symptoms of chest pain a month ago usually when he lays flat.  Followed up with PCP, was given omeprazole which she took for about a week with complete resolution of symptoms.  Has not had any episodes since.  Feels well, denies chest pain or shortness of breath.  Previously smoked cigarettes, currently smokes cigars.  Has been smoking for over 35 years now.  Denies any family history of heart disease.  History reviewed. No pertinent past medical history.  History reviewed. No pertinent surgical history.  Current Medications: Current Meds  Medication Sig   omeprazole (PRILOSEC) 20 MG capsule Take 20 mg by mouth daily.     Allergies:   Patient has no known allergies.   Social History   Socioeconomic History   Marital status: Single    Spouse name: Not on file   Number of children: Not on file   Years of education: Not on file   Highest education level: Not on file  Occupational History   Not on file  Tobacco Use   Smoking status: Every Day    Types: Cigars   Smokeless tobacco: Never  Vaping Use   Vaping Use: Never used  Substance and Sexual Activity   Alcohol use: Yes    Comment: occasional   Drug use: No   Sexual activity: Not on file  Other Topics Concern   Not on file  Social History Narrative   Not on file   Social Determinants of Health   Financial Resource Strain: Not on file  Food Insecurity: Not on file   Transportation Needs: Not on file  Physical Activity: Not on file  Stress: Not on file  Social Connections: Not on file     Family History: The patient's family history is not on file.  ROS:   Please see the history of present illness.     All other systems reviewed and are negative.  EKGs/Labs/Other Studies Reviewed:    The following studies were reviewed today:  EKG Interpretation Date/Time:  Wednesday August 01 2022 10:02:48 EDT Ventricular Rate:  69 PR Interval:  158 QRS Duration:  92 QT Interval:  372 QTC Calculation: 398 R Axis:   68  Text Interpretation: Normal sinus rhythm Normal ECG Confirmed by Debbe Odea ([id number]) on 08/01/2022 10:11:24 AM    Recent Labs: No results found for requested labs within last 365 days.  Recent Lipid Panel No results found for: "CHOL", "TRIG", "HDL", "CHOLHDL", "VLDL", "LDLCALC", "LDLDIRECT"   Risk Assessment/Calculations:     HYPERTENSION CONTROL Vitals:   08/01/22 0956 08/01/22 1004  BP: (!) 124/90 (!) 140/84    The patient's blood pressure is elevated above target today.  In order to address the patient's elevated BP: Blood pressure will be monitored at home to determine if medication changes need to be made.  Physical Exam:    VS:  BP (!) 140/84 (BP Location: Right Arm, Patient Position: Sitting, Cuff Size: Normal)   Pulse 69   Ht 5\' 3"  (1.6 m)   Wt 132 lb 8 oz (60.1 kg)   SpO2 99%   BMI 23.47 kg/m     Wt Readings from Last 3 Encounters:  08/01/22 132 lb 8 oz (60.1 kg)  02/16/22 150 lb (68 kg)  07/18/17 150 lb (68 kg)     GEN:  Well nourished, well developed in no acute distress HEENT: Normal NECK: No JVD; No carotid bruits CARDIAC: RRR, no murmurs, rubs, gallops RESPIRATORY:  Clear to auscultation without rales, wheezing or rhonchi  ABDOMEN: Soft, non-tender, non-distended MUSCULOSKELETAL:  No edema; No deformity  SKIN: Warm and dry NEUROLOGIC:  Alert and oriented x 3 PSYCHIATRIC:   Normal affect   ASSESSMENT:    1. Precordial chest pain   2. Smoking    PLAN:    In order of problems listed above:  Chest pain, likely from GERD, resolved with omeprazole.  Risk factors current smoker.  Obtain echo to rule out any structural abnormalities.  If echo is normal, and symptoms stay resolved, no indication for additional testing. Current smoker, cessation advised.  Follow-up after echo, or as needed if echocardiogram is without significant abnormalities.     Medication Adjustments/Labs and Tests Ordered: Current medicines are reviewed at length with the patient today.  Concerns regarding medicines are outlined above.  Orders Placed This Encounter  Procedures   EKG 12-Lead   ECHOCARDIOGRAM COMPLETE   No orders of the defined types were placed in this encounter.   Patient Instructions  Medication Instructions:  Your physician recommends that you continue on your current medications as directed. Please refer to the Current Medication list given to you today.  *If you need a refill on your cardiac medications before your next appointment, please call your pharmacy*  Lab Work: -None ordered  Testing/Procedures: Your physician has requested that you have an echocardiogram. Echocardiography is a painless test that uses sound waves to create images of your heart. It provides your doctor with information about the size and shape of your heart and how well your heart's chambers and valves are working. This procedure takes approximately one hour. There are no restrictions for this procedure. Please do NOT wear cologne, perfume, aftershave, or lotions (deodorant is allowed). Please arrive 15 minutes prior to your appointment time.    Follow-Up: At [facility], you and your health needs are our priority.  As part of our continuing mission to provide you with exceptional heart care, we have created designated Provider Care Teams.  These Care Teams include your  primary Cardiologist (physician) and Advanced Practice Providers (APPs -  Physician Assistants and Nurse Practitioners) who all work together to provide you with the care you need, when you need it.  We recommend signing up for the patient portal called "MyChart".  Sign up information is provided on this After Visit Summary.  MyChart is used to connect with patients for Virtual Visits (Telemedicine).  Patients are able to view lab/test results, encounter notes, upcoming appointments, etc.  Non-urgent messages can be sent to your provider as well.   To learn more about what you can do with MyChart, go to [email or web address].    Your next appointment:   2 - 3 month(s)  Provider:   You may see Debbe Odea, MD or one of the following Advanced Practice Providers on your designated Care  Team:   Nicolasa Ducking, NP Eula Listen, PA-C Cadence Fransico Liev, PA-C Charlsie Quest, NP    Other Instructions -None    Signed, Debbe Odea, MD  08/01/2022 10:59 AM    [facility]

## 2022-08-22 ENCOUNTER — Ambulatory Visit: Payer: 59 | Attending: Cardiology

## 2022-09-10 ENCOUNTER — Other Ambulatory Visit: Payer: Self-pay | Admitting: Cardiology

## 2022-09-10 DIAGNOSIS — R072 Precordial pain: Secondary | ICD-10-CM

## 2022-09-10 DIAGNOSIS — F17200 Nicotine dependence, unspecified, uncomplicated: Secondary | ICD-10-CM

## 2022-09-24 ENCOUNTER — Ambulatory Visit: Payer: 59 | Attending: Cardiology

## 2022-09-24 DIAGNOSIS — R072 Precordial pain: Secondary | ICD-10-CM

## 2022-09-24 LAB — ECHOCARDIOGRAM COMPLETE
Area-P 1/2: 3.99 cm2
S' Lateral: 2.9 cm

## 2022-10-09 ENCOUNTER — Encounter: Payer: Self-pay | Admitting: Cardiology

## 2022-10-09 ENCOUNTER — Ambulatory Visit: Payer: 59 | Attending: Cardiology | Admitting: Cardiology

## 2023-12-19 LAB — COLOGUARD: COLOGUARD: POSITIVE — AB
